# Patient Record
Sex: Female | Born: 1980 | Race: Black or African American | Hispanic: No | Marital: Single | Smoking: Never smoker
Health system: Southern US, Community
[De-identification: ages and names within clinical notes are randomized; demographics above are authoritative.]

---
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
## 2000-01-05 ENCOUNTER — Emergency Department (HOSPITAL_COMMUNITY): Admission: EM | Admit: 2000-01-05 | Discharge: 2000-01-05 | Payer: Self-pay | Admitting: Emergency Medicine

## 2000-01-05 ENCOUNTER — Encounter: Payer: Self-pay | Admitting: Emergency Medicine

## 2000-12-02 ENCOUNTER — Inpatient Hospital Stay (HOSPITAL_COMMUNITY): Admission: AD | Admit: 2000-12-02 | Discharge: 2000-12-02 | Payer: Self-pay | Admitting: Obstetrics & Gynecology

## 2003-10-18 ENCOUNTER — Inpatient Hospital Stay (HOSPITAL_COMMUNITY): Admission: AD | Admit: 2003-10-18 | Discharge: 2003-10-18 | Payer: Self-pay | Admitting: *Deleted

## 2004-11-24 ENCOUNTER — Other Ambulatory Visit: Admission: RE | Admit: 2004-11-24 | Discharge: 2004-11-24 | Payer: Self-pay | Admitting: Obstetrics and Gynecology

## 2005-02-10 ENCOUNTER — Inpatient Hospital Stay (HOSPITAL_COMMUNITY): Admission: AD | Admit: 2005-02-10 | Discharge: 2005-02-10 | Payer: Self-pay | Admitting: Obstetrics and Gynecology

## 2005-07-16 ENCOUNTER — Emergency Department (HOSPITAL_COMMUNITY): Admission: EM | Admit: 2005-07-16 | Discharge: 2005-07-17 | Payer: Self-pay | Admitting: Emergency Medicine

## 2005-07-29 ENCOUNTER — Inpatient Hospital Stay (HOSPITAL_COMMUNITY): Admission: AD | Admit: 2005-07-29 | Discharge: 2005-07-29 | Payer: Self-pay | Admitting: Obstetrics and Gynecology

## 2005-08-05 ENCOUNTER — Inpatient Hospital Stay (HOSPITAL_COMMUNITY): Admission: AD | Admit: 2005-08-05 | Discharge: 2005-08-07 | Payer: Self-pay | Admitting: Obstetrics and Gynecology

## 2008-02-04 ENCOUNTER — Inpatient Hospital Stay (HOSPITAL_COMMUNITY): Admission: AD | Admit: 2008-02-04 | Discharge: 2008-02-06 | Payer: Self-pay | Admitting: Obstetrics and Gynecology

## 2009-07-26 ENCOUNTER — Emergency Department (HOSPITAL_COMMUNITY): Admission: EM | Admit: 2009-07-26 | Discharge: 2009-07-26 | Payer: Self-pay | Admitting: Family Medicine

## 2010-04-27 LAB — CBC
HCT: 34.5 % — ABNORMAL LOW (ref 36.0–46.0)
HCT: 36.8 % (ref 36.0–46.0)
Hemoglobin: 11.3 g/dL — ABNORMAL LOW (ref 12.0–15.0)
Hemoglobin: 12.1 g/dL (ref 12.0–15.0)
MCHC: 32.8 g/dL (ref 30.0–36.0)
MCHC: 33 g/dL (ref 30.0–36.0)
MCV: 84.7 fL (ref 78.0–100.0)
MCV: 85.3 fL (ref 78.0–100.0)
Platelets: 149 10*3/uL — ABNORMAL LOW (ref 150–400)
Platelets: 164 10*3/uL (ref 150–400)
RBC: 4.04 MIL/uL (ref 3.87–5.11)
RBC: 4.34 MIL/uL (ref 3.87–5.11)
RDW: 14.9 % (ref 11.5–15.5)
RDW: 15.2 % (ref 11.5–15.5)
WBC: 7.8 10*3/uL (ref 4.0–10.5)
WBC: 8.3 10*3/uL (ref 4.0–10.5)

## 2010-04-27 LAB — RPR: RPR Ser Ql: NONREACTIVE

## 2010-05-26 NOTE — Discharge Summary (Signed)
Stacy Potter, Stacy Potter          ACCOUNT NO.:  [id number]   MEDICAL RECORD NO.:  [id number]          PATIENT TYPE:  INP   LOCATION:  9103                          FACILITY:  [facility]   PHYSICIAN:  Sherron Monday, MD        DATE OF BIRTH:  Apr 09, 1980   DATE OF ADMISSION:  02/04/2008  DATE OF DISCHARGE:                               DISCHARGE SUMMARY   ADMISSION DIAGNOSIS:  Intrauterine pregnancy at term in labor.   DISCHARGE DIAGNOSIS:  Intrauterine pregnancy at term in labor, delivered  via spontaneous vaginal delivery.   HISTORY OF PRESENT ILLNESS:  A 30 year old, G2, P1, at 54 plus weeks  with contractions that she states of increased in intensity and  frequency over the last several hours.  Good fetal movement.  No loss of  fluid.  No vaginal bleeding.  Complicated prenatal care.   PAST MEDICAL HISTORY:  Nonsignificant.   PAST SURGICAL HISTORY:  Nonsignificant.   PAST OB/GYN HISTORY:  G1 was a spontaneous vaginal delivery of a 6  pounds 6 ounces female infant.  G2 is the present pregnancy.  No  abnormal Pap smears or sexually transmitted diseases.   MEDICATIONS:  None.   ALLERGIES:  No known drug allergies.   SOCIAL HISTORY:  Denies alcohol, tobacco, or drug use.  She is single.   FAMILY HISTORY:  Significant for breast cancer in her mother.   PRENATAL LABS:  Hemoglobin of 12.3 and platelets 203,000.  Blood type B  positive and antibody screen negative.  A urinalysis was positive for  group B strep.  Gonorrhea negative, Chlamydia negative, RPR nonreactive,  and rubella immune.  Cystic fibrosis screen was negative.  Hepatitis B  surface antigen was negative.  HIV is negative.  MSAFP within normal  limits.  Glucola was 91.  First trimester screen was also within normal  limits.  Ultrasound performed on 17 plus 6 weeks in September 08, 2007,  revealed normal anatomy, posterior placenta and a female infant.   PHYSICAL EXAMINATION ON ADMISSION:  She is afebrile.  Vital signs stable  at the benign exam.  Fetal heart tones are in the 130s-140s and reactive with contractions  every 2 minutes.  Vaginal exam, should change in the MAU from 2-4, 50, and -2, she was  admitted and given penicillin for group B strep per prophylaxis.   Pitocin was then decided to augment her labor.  There is no cervical  change after 4 hours after penicillin.  Her membranes were planned to be  ruptured.  She had AROM for clear fluid and following that progressed to  complete, complete, and +3 and pushed for 2-3 contractions to deliver a  viable female infant at 12:30 with Apgars of 8 at 1 minute and 9 at 5  minutes and a weight of 7 pounds 13 ounces.  Placenta was expressed  intact.  Second-degree perineal laceration was repaired in the typical  fashion as well as bilateral labia repaired in the typical fashion 3-0  Vicryl.  She had routine postpartum care and without complications and  desired to discharge home on postpartum day #1.  She stated  that she was  ambulating well, tolerating diet, and had normal lochia, and her pain  was controlled.  Her hemoglobin decreased from 12.1 to 11.3.  She will  be discharged to home with routine discharge instructions and numbers to  call with any questions or problems as well as prescriptions for Motrin,  Vicodin, Anusol, and prenatal vitamins.  She voiced understand all of  this and prior to discharge, she will also get a Depo-Provera injection.      Sherron Monday, MD  Electronically Signed     JB/MEDQ  D:  02/06/2008  T:  02/06/2008  Job:  [id number]

## 2010-05-29 NOTE — Discharge Summary (Signed)
Stacy Potter, Stacy Potter          ACCOUNT NO.:  [id number]   MEDICAL RECORD NO.:  [id number]          PATIENT TYPE:  INP   LOCATION:  9130                          FACILITY:  [facility]   PHYSICIAN:  Zenaida Niece, M.D.DATE OF BIRTH:  1980/10/29   DATE OF ADMISSION:  08/05/2005  DATE OF DISCHARGE:  08/07/2005                                 DISCHARGE SUMMARY   ADMISSION DIAGNOSIS:  Intrauterine pregnancy at 37 weeks with premature  rupture of membranes.   DISCHARGE DIAGNOSIS:  Intrauterine pregnancy at 37 weeks with premature  rupture of membranes.   PROCEDURES:  On August 06, 2005, she had a spontaneous vaginal delivery.   HISTORY AND PHYSICAL:  This is a 30 year old black female, gravida 1, para 0  with an EGA of [redacted] weeks, who presents with complaint of possibly leaking  fluid for the past couple days.  She was evaluated in the office on July26,  2007 and had pooling, positive Nitrazine and positive ferning consistent  with rupture of membranes.  Prenatal care has been uncomplicated.   PRENATAL LABS:  Blood type is B+ with a negative antibody screen, sickle  screen is negative, RPR nonreactive, rubella immune, hepatitis B surface  antigen negative, HIV negative, gonorrhea and chlamydia negative.  One-hour  Glucola 105.  Quad screen is normal and group B strep is positive.   PAST HISTORY:  Noncontributory.   PHYSICAL EXAM:  She is afebrile with stable vital signs.  Fetal heart  tracing is normal.  Abdomen is gravid with fundal height of 35 cm.  Cervix  is fingertip, 30, -2 to -3 with a vertex presentation.   HOSPITAL COURSE:  The patient was admitted and put on penicillin for group B  strep prophylaxis and Pitocin augmentation.  She progressed and eventually  on the morning of July27, 2007 reached complete, pushed well and had a  vaginal delivery of a viable female infant with Apgar's of 8 and 9, weight 6  pounds 6 ounces.  Uterus palpated normal.  She had a right labial  laceration  which was hemostatic and not repaired and left labial laceration which was  repaired with 3-0 Vicryl with local block.  Estimated blood loss was 400 mL.  Postpartum, she had no significant complications.  Predelivery hemoglobin  11.6 and postdelivery 10.2.   On postpartum #1, she requested discharge home.  She was felt to be stable  enough to the baby was able to go home so she was discharged home.   DISCHARGE INSTRUCTIONS:  Regular diet, pelvic rest, follow up in 6 weeks.   MEDICATIONS:  Percocet #20 one to two p.o. q.4-6h. p.r.n. pain and over-the-  counter ibuprofen as needed and she is given our discharge pamphlet.      Zenaida Niece, M.D.  Electronically Signed     TDM/MEDQ  D:  08/07/2005  T:  08/07/2005  Job:  [id number]

## 2010-06-10 ENCOUNTER — Ambulatory Visit: Payer: Self-pay | Admitting: Women's Health

## 2011-09-21 ENCOUNTER — Emergency Department (INDEPENDENT_AMBULATORY_CARE_PROVIDER_SITE_OTHER)
Admission: EM | Admit: 2011-09-21 | Discharge: 2011-09-21 | Disposition: A | Discharge status: Home / Self Care | Payer: Self-pay | Source: Home / Self Care

## 2011-09-21 ENCOUNTER — Encounter (HOSPITAL_COMMUNITY): Payer: Self-pay

## 2011-09-21 DIAGNOSIS — Z202 Contact with and (suspected) exposure to infections with a predominantly sexual mode of transmission: Secondary | ICD-10-CM

## 2011-09-21 DIAGNOSIS — Z9189 Other specified personal risk factors, not elsewhere classified: Secondary | ICD-10-CM

## 2011-09-21 LAB — POCT URINALYSIS DIP (DEVICE)
Ketones, ur: NEGATIVE mg/dL
Leukocytes, UA: NEGATIVE
Nitrite: NEGATIVE
Protein, ur: 100 mg/dL — AB
pH: 6 (ref 5.0–8.0)

## 2011-09-21 LAB — POCT PREGNANCY, URINE: Preg Test, Ur: NEGATIVE

## 2011-09-21 LAB — WET PREP, GENITAL: Yeast Wet Prep HPF POC: NONE SEEN

## 2011-09-21 MED ORDER — AZITHROMYCIN 250 MG PO TABS: 500.0000 mg | ORAL_TABLET | Freq: Once | ORAL | Status: AC

## 2011-09-21 NOTE — ED Provider Notes (Signed)
History     CSN: [id number]  Arrival date & time 09/21/11  1844   None     Chief Complaint  Patient presents with  . Vaginitis    was told by sexual partner that he had been dx with chlamydia.  pt. denies any vaginal discharge or symptoms.      (Consider location/radiation/quality/duration/timing/severity/associated sxs/prior treatment) HPI Comments: Sexual partner advised the pt he had chlamydia. She wants to get checked and treated. Denies GU symptoms  The history is provided by the patient.    History reviewed. No pertinent past medical history.  History reviewed. No pertinent past surgical history.  History reviewed. No pertinent family history.  History  Substance Use Topics  . Smoking status: Never Smoker   . Smokeless tobacco: Never Used  . Alcohol Use: No    OB History    Grav Para Term Preterm Abortions TAB SAB Ect Mult Living                  Review of Systems  Constitutional: Negative.   Gastrointestinal: Negative.   Genitourinary: Negative.   Musculoskeletal: Negative.   Psychiatric/Behavioral: Negative.     Allergies  Review of patient's allergies indicates no known allergies.  Home Medications   Current Outpatient Rx  Name Route Sig Dispense Refill  . AZITHROMYCIN 250 MG PO TABS Oral Take 2 tablets (500 mg total) by mouth once. 2 tabs po now 2 tablet 0    BP 127/91  Pulse 73  Temp 98.9 F (37.2 C) (Oral)  Resp 17  SpO2 100%  Physical Exam  Constitutional: She is oriented to person, place, and time. She appears well-developed and well-nourished. No distress.  Neck: Normal range of motion. Neck supple.  Abdominal: Soft. There is no tenderness.  Genitourinary: There is no rash, tenderness, lesion or injury on the right labia. There is no rash, tenderness, lesion or injury on the left labia. There is erythema around the vagina. No tenderness or bleeding around the vagina. No foreign body around the vagina. No signs of injury around the  vagina. Vaginal discharge found.  Neurological: She is alert and oriented to person, place, and time.  Skin: Skin is warm and dry.  Psychiatric: She has a normal mood and affect.    ED Course  Procedures (including critical care time)  Labs Reviewed  POCT URINALYSIS DIP (DEVICE) - Abnormal; Notable for the following:    Bilirubin Urine SMALL (*)     Hgb urine dipstick MODERATE (*)     Protein, ur 100 (*)     All other components within normal limits  WET PREP, GENITAL - Abnormal; Notable for the following:    Clue Cells Wet Prep HPF POC FEW (*)     WBC, Wet Prep HPF POC FEW (*)     All other components within normal limits  POCT PREGNANCY, URINE  GC/CHLAMYDIA PROBE AMP, GENITAL   No results found.   1. Possible exposure to STD       MDM  Azithromycin 1 gm po Wet prep and DNA probe.    Results for orders placed during the hospital encounter of 09/21/11  POCT URINALYSIS DIP (DEVICE)      Component Value Range   Glucose, UA NEGATIVE  NEGATIVE mg/dL   Bilirubin Urine SMALL (*) NEGATIVE   Ketones, ur NEGATIVE  NEGATIVE mg/dL   Specific Gravity, Urine >=1.030  1.005 - 1.030   Hgb urine dipstick MODERATE (*) NEGATIVE   pH 6.0  5.0 - 8.0   Protein, ur 100 (*) NEGATIVE mg/dL   Urobilinogen, UA 1.0  0.0 - 1.0 mg/dL   Nitrite NEGATIVE  NEGATIVE   Leukocytes, UA NEGATIVE  NEGATIVE  POCT PREGNANCY, URINE      Component Value Range   Preg Test, Ur NEGATIVE  NEGATIVE  WET PREP, GENITAL      Component Value Range   Yeast Wet Prep HPF POC NONE SEEN  NONE SEEN   Trich, Wet Prep NONE SEEN  NONE SEEN   Clue Cells Wet Prep HPF POC FEW (*) NONE SEEN   WBC, Wet Prep HPF POC FEW (*) NONE SEEN        Hayden Rasmussen, NP 09/21/11 2300

## 2011-09-21 NOTE — ED Provider Notes (Signed)
Medical screening examination/treatment/procedure(s) were performed by non-physician practitioner and as supervising physician I was immediately available for consultation/collaboration.  Leslee Home, M.D.   Reuben Likes, MD 09/21/11 [id number]

## 2011-09-21 NOTE — ED Notes (Signed)
was told by sexual partner that he had been dx with chlamydia.  pt. denies any vaginal discharge or symptoms

## 2011-09-22 ENCOUNTER — Telehealth (HOSPITAL_COMMUNITY): Payer: Self-pay | Admitting: *Deleted

## 2011-09-22 NOTE — ED Notes (Signed)
Pt. called in for her lab results. Pt. verified x 2 and given results.  ( GC/Chlamydia neg., Wet prep: few clue cells, few WBC's, U- dip).  Pt. asked about the blood in her urine. Denies vaginal bleeding or UTI symptoms.  States she has been spotting off and on with [facility]. Told her symptoms of UTI and to call back if any symptoms. Vassie Moselle 09/22/2011

## 2018-01-23 ENCOUNTER — Telehealth: Payer: Self-pay | Admitting: Licensed Clinical Social Worker

## 2018-01-23 ENCOUNTER — Encounter: Payer: Self-pay | Admitting: Licensed Clinical Social Worker

## 2018-01-23 NOTE — Telephone Encounter (Signed)
A genetic counseling appt has been scheduled for the pt to see Brianna Cowan on 1/30 at 1pm. Letter mailed. °

## 2018-02-09 ENCOUNTER — Inpatient Hospital Stay: Payer: BC Managed Care – PPO

## 2018-02-09 ENCOUNTER — Inpatient Hospital Stay: Payer: BC Managed Care – PPO | Attending: Genetic Counselor | Admitting: Licensed Clinical Social Worker

## 2019-01-11 ENCOUNTER — Other Ambulatory Visit: Payer: Self-pay | Admitting: Obstetrics and Gynecology

## 2019-01-11 DIAGNOSIS — R928 Other abnormal and inconclusive findings on diagnostic imaging of breast: Secondary | ICD-10-CM

## 2019-01-18 ENCOUNTER — Ambulatory Visit
Admission: RE | Admit: 2019-01-18 | Discharge: 2019-01-18 | Disposition: A | Discharge status: Home / Self Care | Payer: BC Managed Care – PPO | Source: Ambulatory Visit | Attending: Obstetrics and Gynecology | Admitting: Obstetrics and Gynecology

## 2019-01-18 ENCOUNTER — Ambulatory Visit: Payer: BC Managed Care – PPO

## 2019-01-18 ENCOUNTER — Other Ambulatory Visit: Payer: Self-pay

## 2019-01-18 DIAGNOSIS — R928 Other abnormal and inconclusive findings on diagnostic imaging of breast: Secondary | ICD-10-CM

## 2019-03-10 ENCOUNTER — Ambulatory Visit: Payer: BC Managed Care – PPO

## 2020-11-30 IMAGING — MG MM DIGITAL DIAGNOSTIC UNILAT*R* W/ TOMO W/ CAD
6 series · 6 of 18 positions shown · non-contrast
Comparison: January 09, 2019

CLINICAL DATA: 38-year-old patient recalled from recent screening
mammogram for evaluation possible mass in the right breast.Patient's
mother has a history of breast cancer.

EXAM:
DIGITAL DIAGNOSTIC UNILATERAL RIGHT MAMMOGRAM WITH CAD AND TOMO

[R MLO synth-2D (1 of 2)]
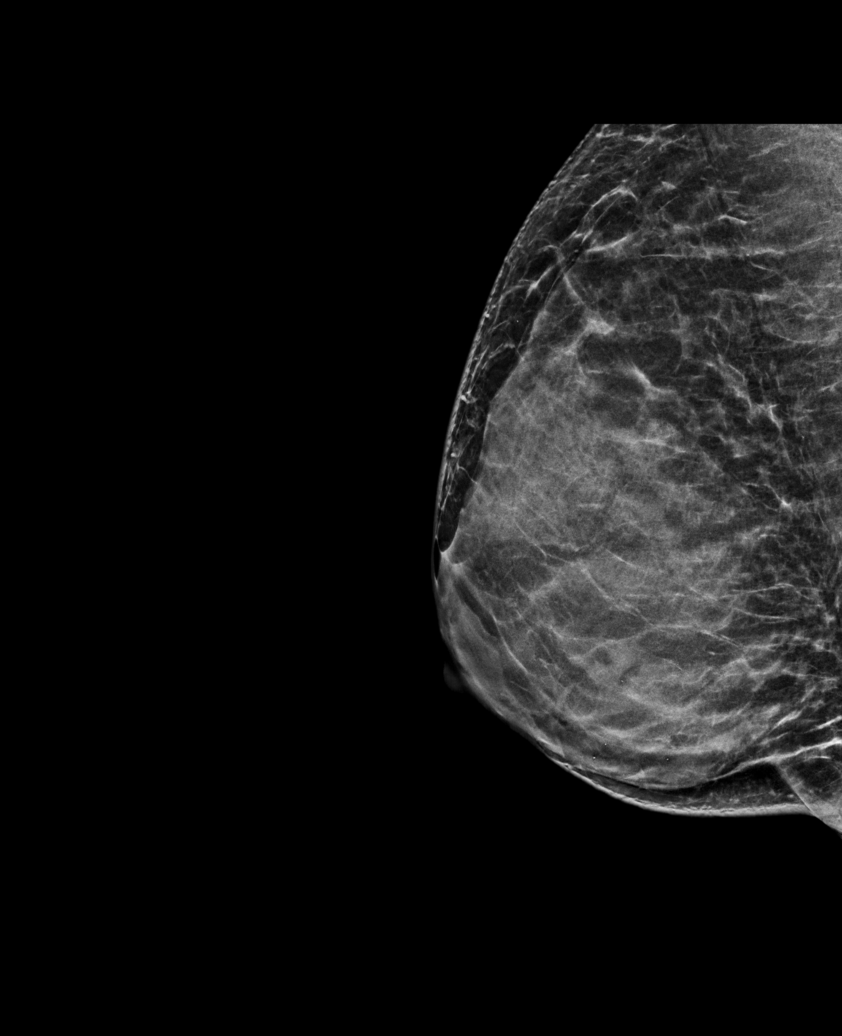

[R MLO synth-2D (2 of 2)]
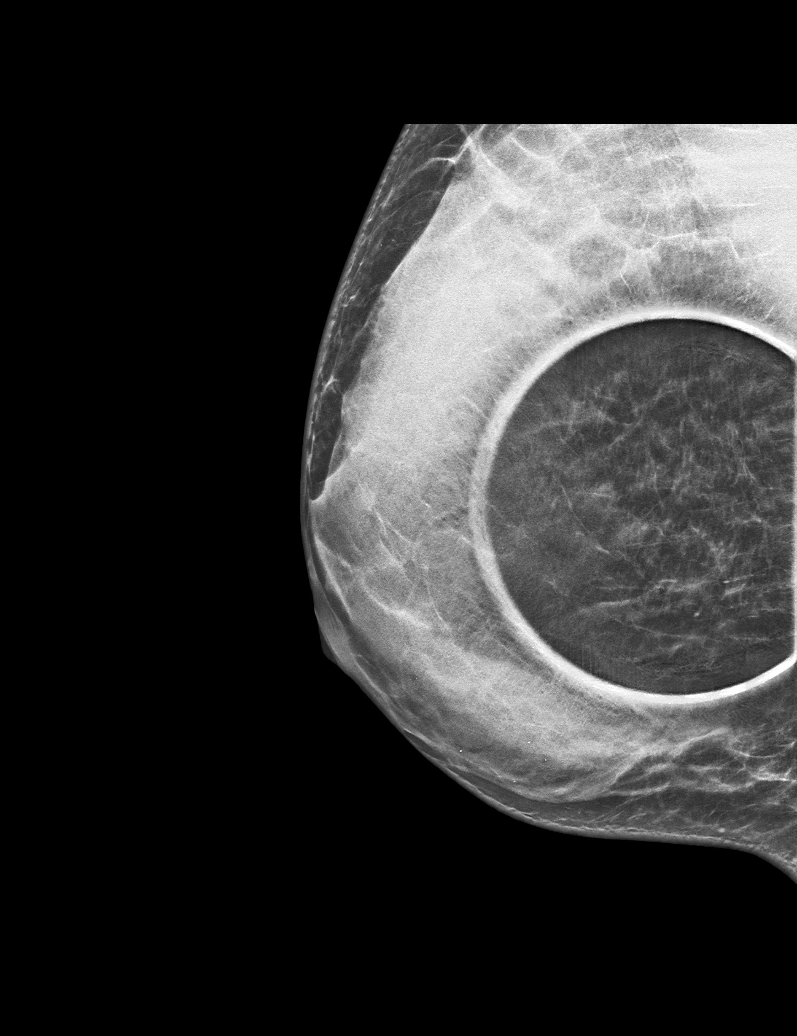

[R CC synth-2D]
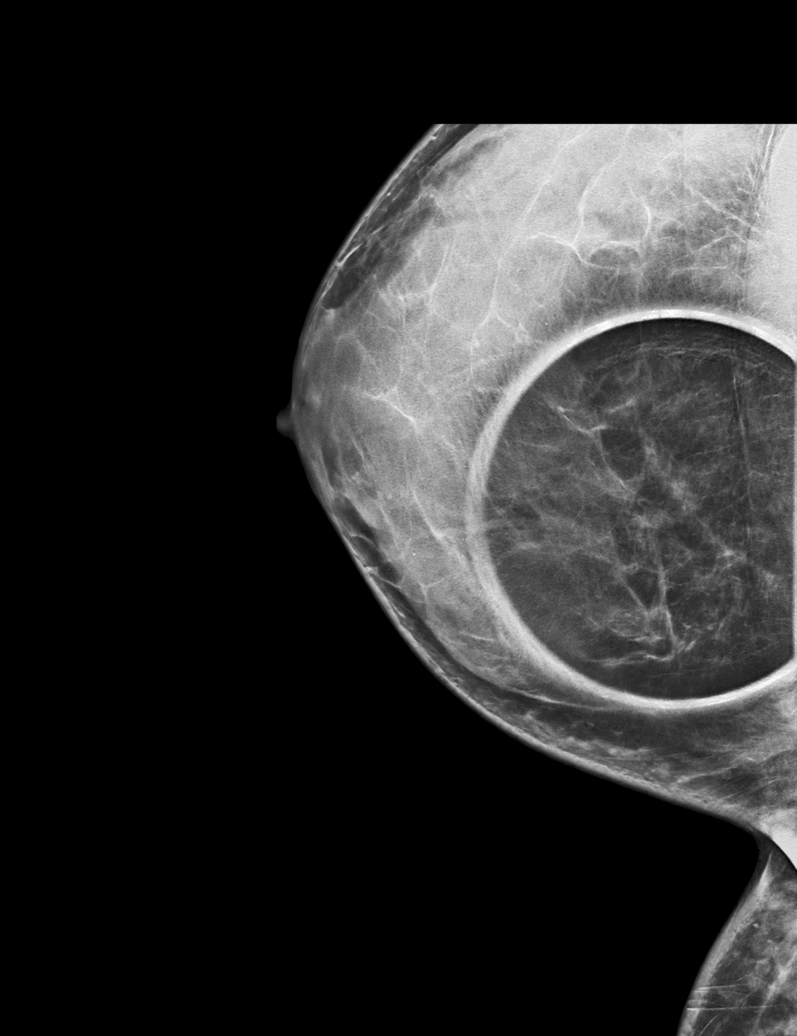

[R CC tomo · tomo slice 29/57.0]
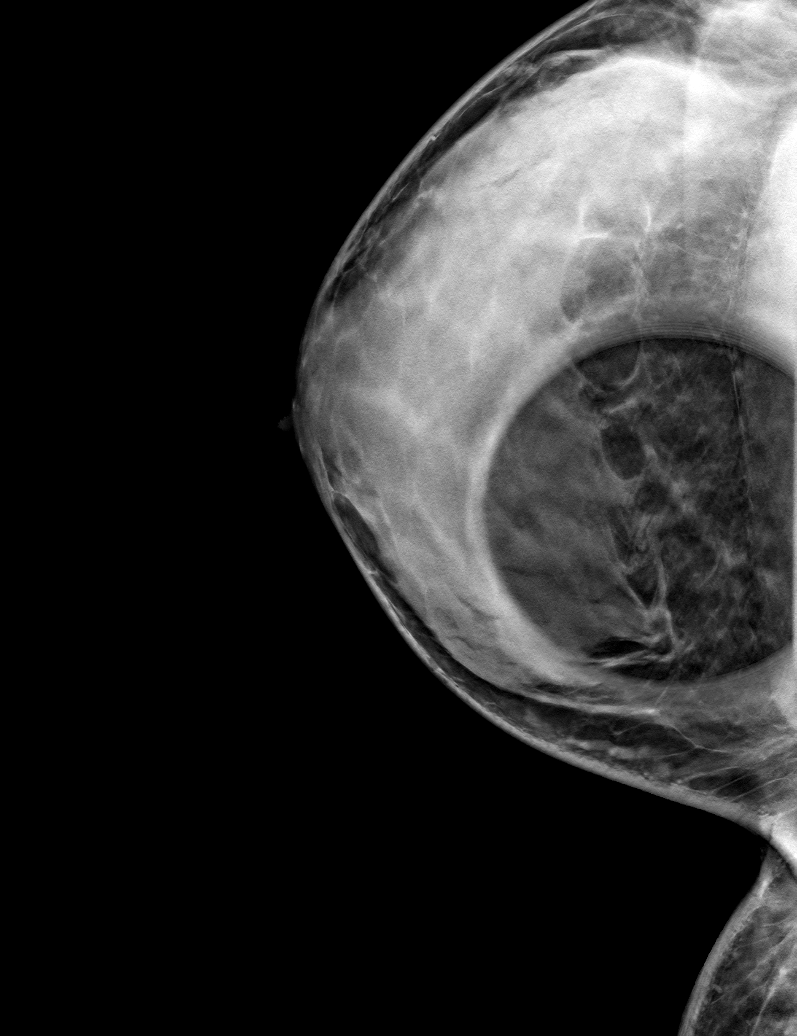

[R MLO tomo (1 of 2) · tomo slice 27/54.0]
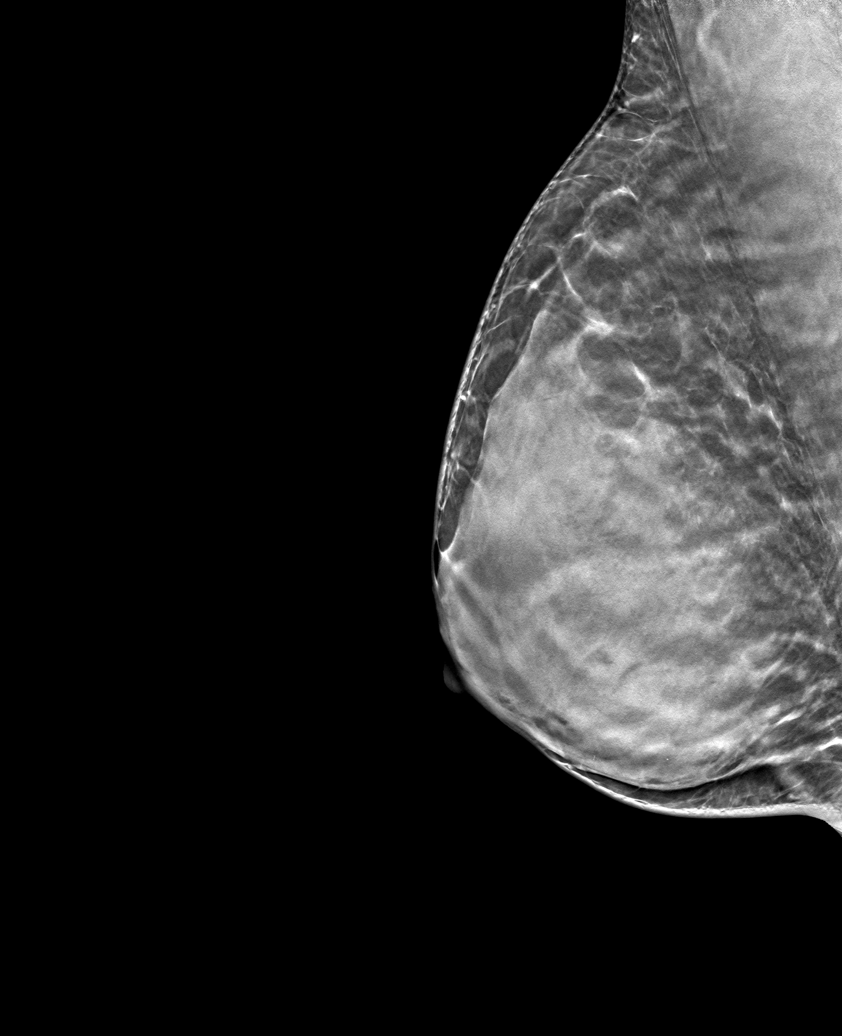

[R MLO tomo (2 of 2) · tomo slice 25/49.0]
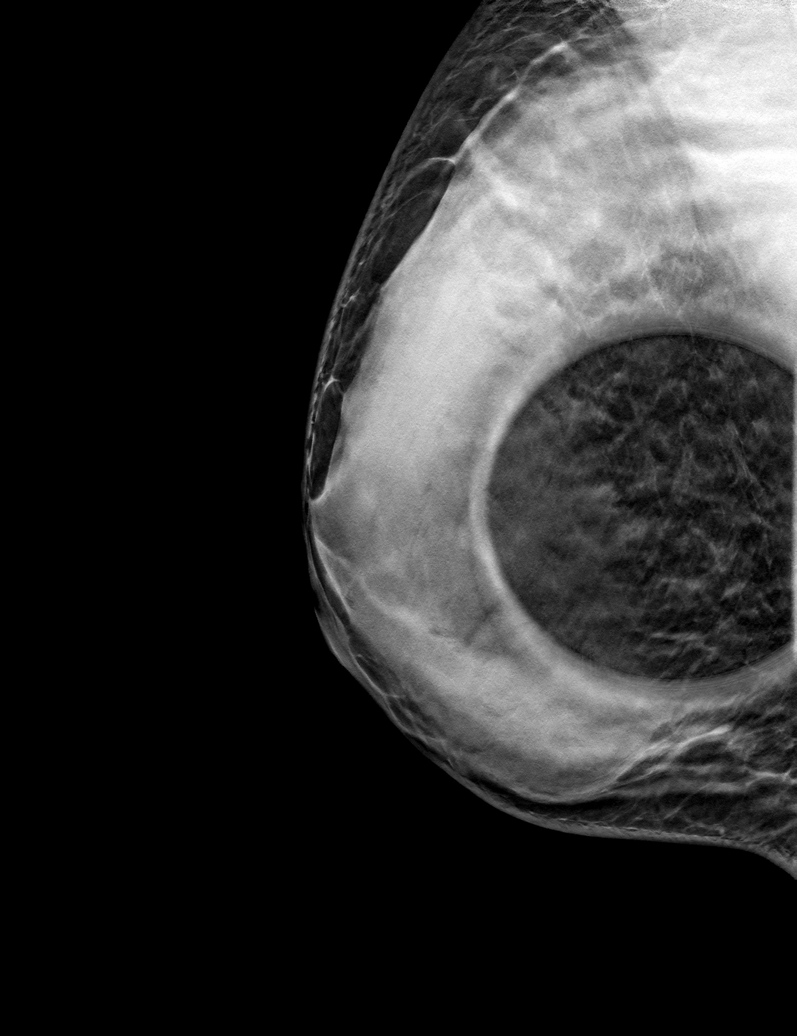

[6 of 18 positions shown; findings below may reference images not displayed]

ACR Breast Density Category d: The breast tissue is extremely dense,
which lowers the sensitivity of mammography.
FINDINGS: Spot compression views of the posterior third medial right breast
confirm curvilinear blood vessels with overlapping fibroglandular
tissue. The blood vessels accounted for the area of concern in the
cc projection. Whole breast MLO view shows no suspicious findings
posteriorly. Curvilinear blood vessels are seen the posteromedial
right breast corresponding well with the findings on the CC
projection. Spot compression view in the MLO projection is negative.

Mammographic images were processed with CAD.
IMPRESSION: Curvilinear blood vessels accounted for the area of concern on
recent screening mammogram. No suspicious findings.

RECOMMENDATION:
Screening mammogram in one year.(Code:Y6-5-ED5)

I have discussed the findings and recommendations with the patient.
If applicable, a reminder letter will be sent to the patient
regarding the next appointment.

BI-RADS CATEGORY  1: Negative.

## 2021-04-14 ENCOUNTER — Other Ambulatory Visit: Payer: Self-pay | Admitting: Obstetrics and Gynecology

## 2021-04-14 DIAGNOSIS — R928 Other abnormal and inconclusive findings on diagnostic imaging of breast: Secondary | ICD-10-CM

## 2021-04-29 ENCOUNTER — Other Ambulatory Visit: Payer: BC Managed Care – PPO

## 2021-05-11 ENCOUNTER — Ambulatory Visit
Admission: RE | Admit: 2021-05-11 | Discharge: 2021-05-11 | Disposition: A | Discharge status: Home / Self Care | Payer: Self-pay | Source: Ambulatory Visit | Attending: Obstetrics and Gynecology | Admitting: Obstetrics and Gynecology

## 2021-05-11 ENCOUNTER — Other Ambulatory Visit: Payer: Self-pay | Admitting: Obstetrics and Gynecology

## 2021-05-11 ENCOUNTER — Ambulatory Visit
Admission: RE | Admit: 2021-05-11 | Discharge: 2021-05-11 | Disposition: A | Discharge status: Home / Self Care | Payer: PRIVATE HEALTH INSURANCE | Source: Ambulatory Visit | Attending: Obstetrics and Gynecology | Admitting: Obstetrics and Gynecology

## 2021-05-11 DIAGNOSIS — R928 Other abnormal and inconclusive findings on diagnostic imaging of breast: Secondary | ICD-10-CM

## 2021-05-11 DIAGNOSIS — N6320 Unspecified lump in the left breast, unspecified quadrant: Secondary | ICD-10-CM

## 2021-05-18 ENCOUNTER — Ambulatory Visit
Admission: RE | Admit: 2021-05-18 | Discharge: 2021-05-18 | Disposition: A | Discharge status: Home / Self Care | Payer: PRIVATE HEALTH INSURANCE | Source: Ambulatory Visit | Attending: Obstetrics and Gynecology | Admitting: Obstetrics and Gynecology

## 2021-05-18 DIAGNOSIS — N6320 Unspecified lump in the left breast, unspecified quadrant: Secondary | ICD-10-CM

## 2022-06-10 ENCOUNTER — Encounter: Payer: Self-pay | Admitting: Obstetrics and Gynecology

## 2024-02-02 ENCOUNTER — Other Ambulatory Visit: Payer: Self-pay | Admitting: Obstetrics and Gynecology

## 2024-02-02 DIAGNOSIS — R92343 Mammographic extreme density, bilateral breasts: Secondary | ICD-10-CM
